# Patient Record
Sex: Male | Born: 1945 | Race: White | Hispanic: No | Marital: Married
Health system: Southern US, Community
[De-identification: ages and names within clinical notes are randomized; demographics above are authoritative.]

---

## 2004-10-02 ENCOUNTER — Ambulatory Visit: Payer: Self-pay | Admitting: Pulmonary Disease

## 2005-03-05 ENCOUNTER — Encounter: Admission: RE | Admit: 2005-03-05 | Discharge: 2005-03-05 | Payer: Self-pay | Admitting: Internal Medicine

## 2005-06-25 ENCOUNTER — Encounter: Admission: RE | Admit: 2005-06-25 | Discharge: 2005-06-25 | Payer: Self-pay | Admitting: Internal Medicine

## 2005-09-27 ENCOUNTER — Ambulatory Visit: Payer: Self-pay | Admitting: Pulmonary Disease

## 2005-10-18 ENCOUNTER — Ambulatory Visit: Payer: Self-pay | Admitting: Pulmonary Disease

## 2006-03-05 ENCOUNTER — Encounter: Admission: RE | Admit: 2006-03-05 | Discharge: 2006-03-05 | Payer: Self-pay | Admitting: Specialist

## 2007-01-08 ENCOUNTER — Encounter: Admission: RE | Admit: 2007-01-08 | Discharge: 2007-01-08 | Payer: Self-pay | Admitting: General Surgery

## 2007-01-28 ENCOUNTER — Ambulatory Visit (HOSPITAL_COMMUNITY): Admission: RE | Admit: 2007-01-28 | Discharge: 2007-01-28 | Payer: Self-pay | Admitting: General Surgery

## 2007-04-05 ENCOUNTER — Emergency Department (HOSPITAL_COMMUNITY): Admission: EM | Admit: 2007-04-05 | Discharge: 2007-04-05 | Payer: Self-pay | Admitting: Family Medicine

## 2007-08-14 ENCOUNTER — Encounter: Admission: RE | Admit: 2007-08-14 | Discharge: 2007-08-14 | Payer: Self-pay | Admitting: Cardiology

## 2007-08-20 ENCOUNTER — Inpatient Hospital Stay (HOSPITAL_COMMUNITY): Admission: RE | Admit: 2007-08-20 | Discharge: 2007-08-21 | Payer: Self-pay | Admitting: Cardiology

## 2007-10-27 ENCOUNTER — Encounter: Admission: RE | Admit: 2007-10-27 | Discharge: 2007-12-01 | Payer: Self-pay | Admitting: General Surgery

## 2007-11-10 ENCOUNTER — Inpatient Hospital Stay (HOSPITAL_COMMUNITY): Admission: RE | Admit: 2007-11-10 | Discharge: 2007-11-11 | Payer: Self-pay | Admitting: General Surgery

## 2007-12-03 ENCOUNTER — Encounter: Admission: RE | Admit: 2007-12-03 | Discharge: 2008-01-13 | Payer: Self-pay | Admitting: General Surgery

## 2008-01-19 ENCOUNTER — Encounter: Admission: RE | Admit: 2008-01-19 | Discharge: 2008-01-19 | Payer: Self-pay | Admitting: General Surgery

## 2009-02-03 ENCOUNTER — Encounter: Admission: RE | Admit: 2009-02-03 | Discharge: 2009-02-03 | Payer: Self-pay | Admitting: General Surgery

## 2009-11-09 ENCOUNTER — Ambulatory Visit (HOSPITAL_COMMUNITY): Admission: RE | Admit: 2009-11-09 | Discharge: 2009-11-09 | Payer: Self-pay | Admitting: Surgery

## 2010-02-13 IMAGING — RF DG UGI W/ KUB
17 of 24 series · 17 of 24 positions shown · non-contrast
Comparison: Abdominal film dated 11/11/2007 and preoperative upper
GI series dated 01/28/2007

CLINICAL DATA: Status post lap band surgery in November 2007.
The patient is experiencing periodic nausea and vomiting.

UPPER GI SERIES WITH KUB
TECHNIQUE: Routine upper GI series was performed with thin barium.
Fluoroscopy Time: 3.2 minutes

[Series 1: run · 1 of 1 slices shown (1 of 16)]
[im 1/1]
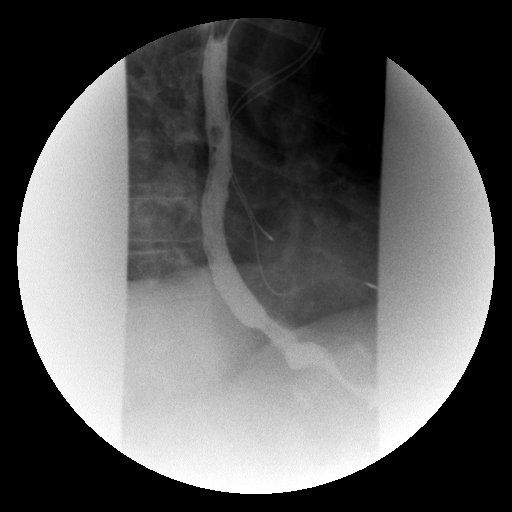

[Series 3: run · 1 of 4 slices shown (2 of 16)]
[im 1/4]
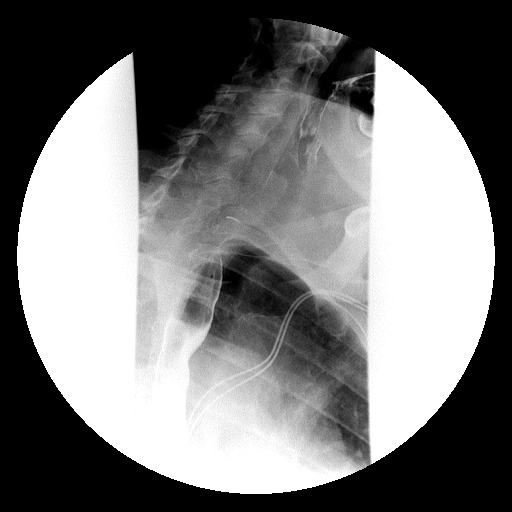

[Series 4: run · 1 of 3 slices shown (3 of 16)]
[im 1/3]
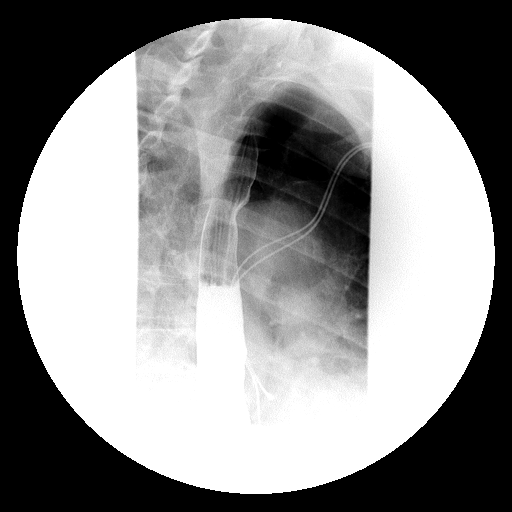

[Series 5: run · 1 of 1 slices shown (4 of 16)]
[im 1/1]
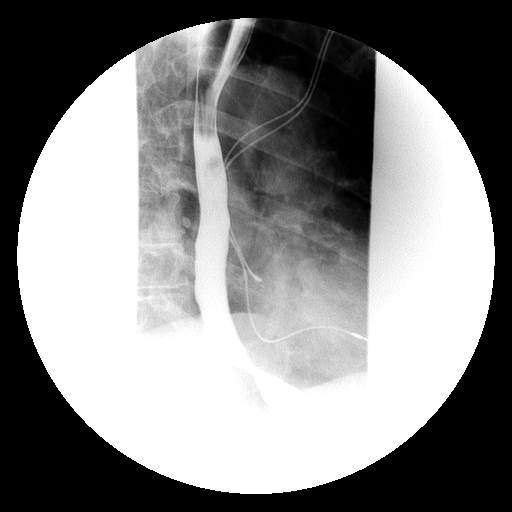

[Series 7: run · 1 of 1 slices shown (5 of 16)]
[im 1/1]
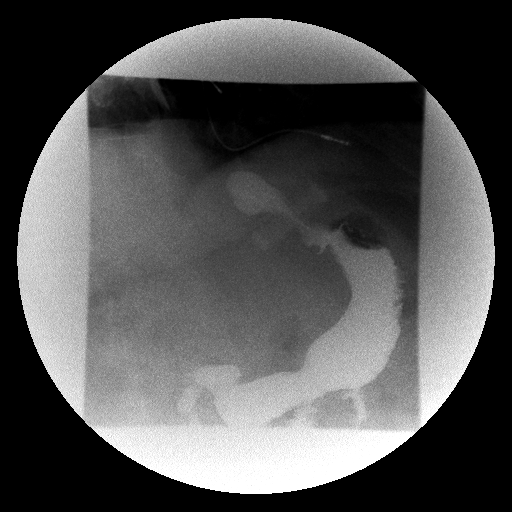

[Series 8: run · 1 of 1 slices shown (6 of 16)]
[im 1/1]
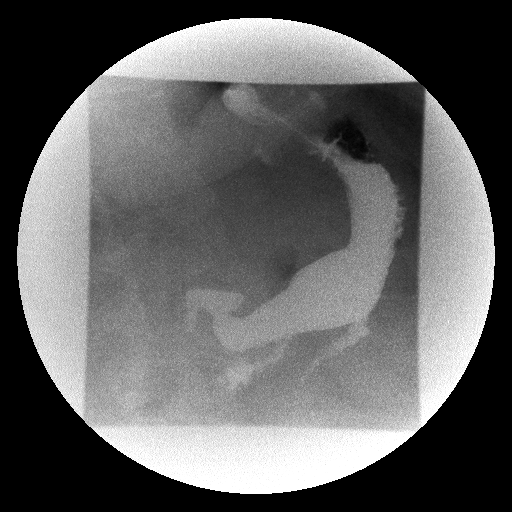

[Series 10: run · 1 of 1 slices shown (7 of 16)]
[im 1/1]
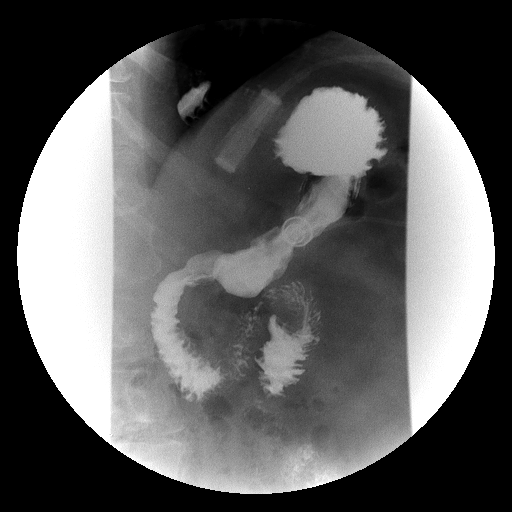

[Series 11: run · 1 of 1 slices shown (8 of 16)]
[im 1/1]
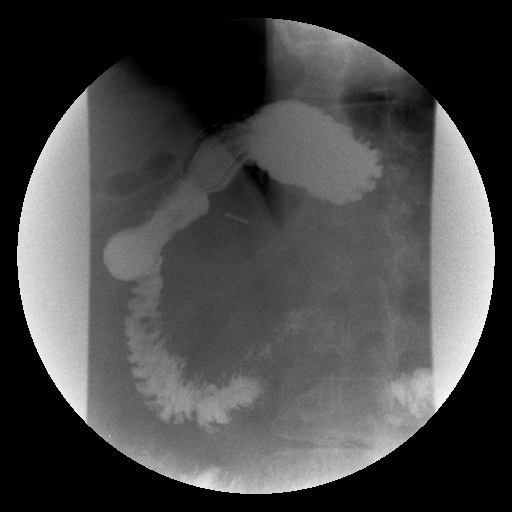

[Series 13: run · 1 of 1 slices shown (9 of 16)]
[im 1/1]
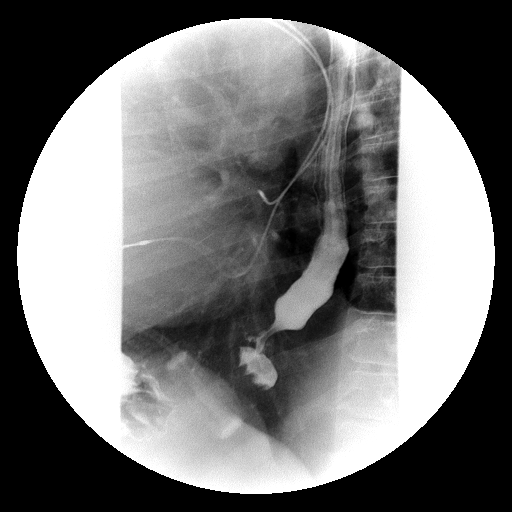

[Series 14: run · 1 of 1 slices shown (10 of 16)]
[im 1/1]
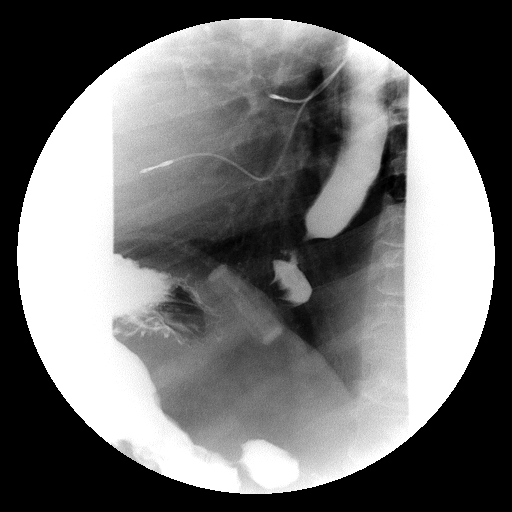

[Series 15: run · 1 of 1 slices shown (11 of 16)]
[im 1/1]
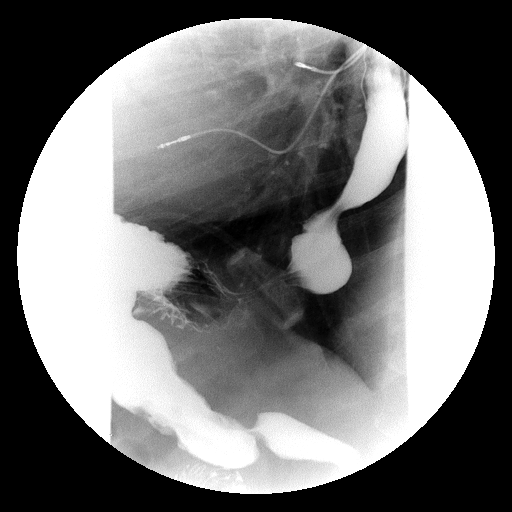

[Series 17: run · 1 of 1 slices shown (12 of 16)]
[im 1/1]
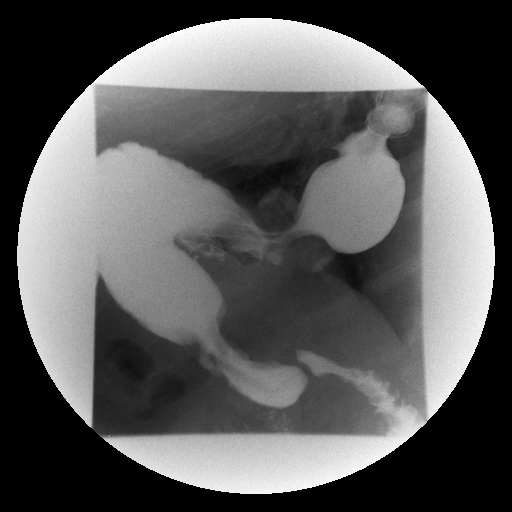

[Series 18: run · 1 of 1 slices shown (13 of 16)]
[im 1/1]
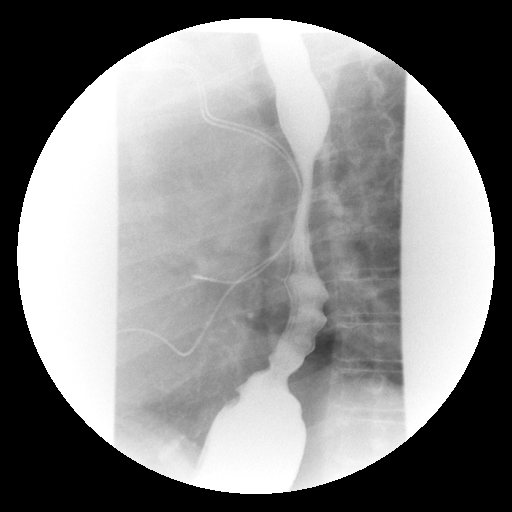

[Series 20: run · 1 of 1 slices shown (14 of 16)]
[im 1/1]
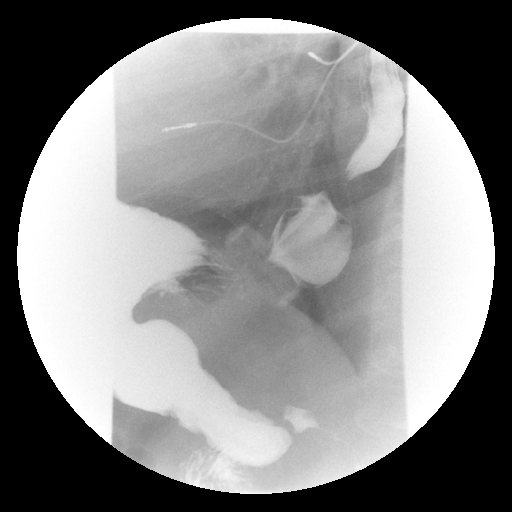

[Series 21: run · 1 of 1 slices shown (15 of 16)]
[im 1/1]
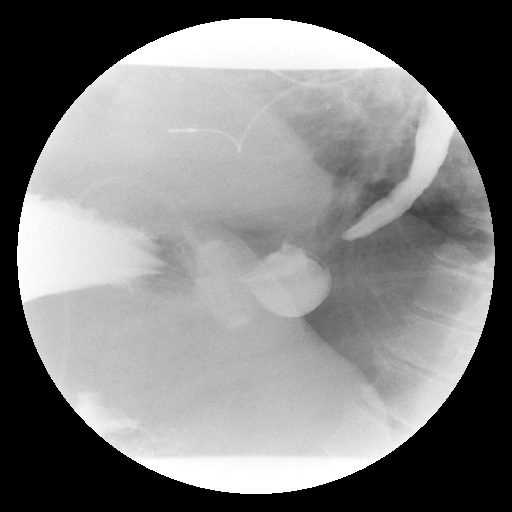

[Series 22: run · 1 of 1 slices shown (16 of 16)]
[im 1/1]
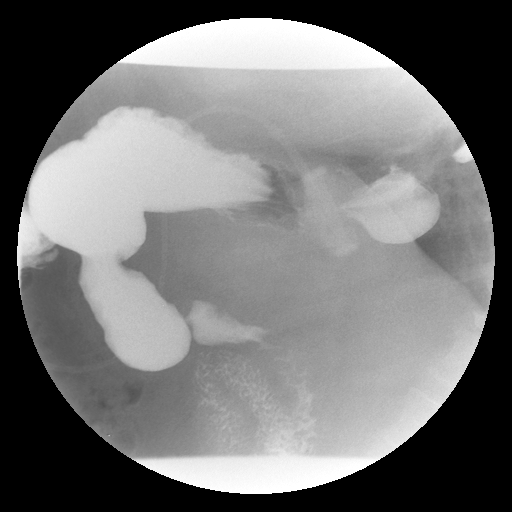

[Series 1001: view not recorded · 0.20mm/px · 1 of 1 slices shown]
[im 1/1]
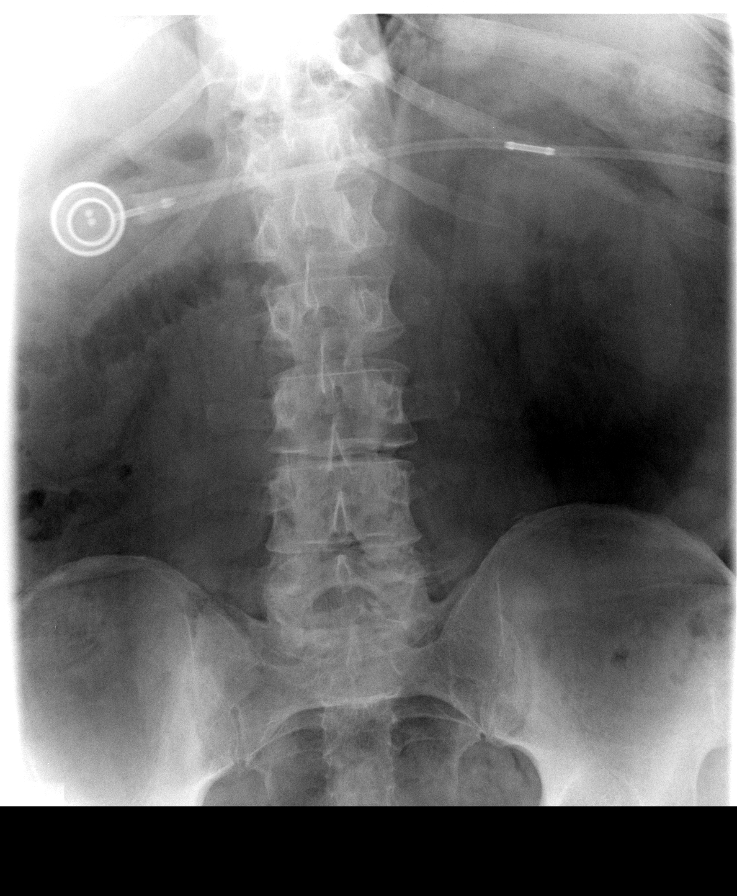

[17 of 24 positions shown; findings below may reference images not displayed]

FINDINGS: Initial KUB demonstrates stable positioning of the lap
band oriented in roughly a 2 o'clock to 8 o'clock position.
Initial upright imaging shows transit of barium through the
esophagus and proximal stomach without evidence of obstruction at
the level of the lap band.

In the prone drinking position, there was evidence of esophageal
dysmotility with tertiary contractions present.  There also is
evidence of a sliding hiatal hernia above the level of the lap
band.  Size of this hernia is very similar to the appearance on the
preoperative upper GI series.  There also does appear to be a
potential component of mild distal esophageal stricture just above
the hiatal hernia in a similar appearance to the preoperative upper
GI series.  A barium tablet was not given due to the presence of
the lap band.

Opening at the level of the lap band is not obstructive to the
passage of barium.  Lumen at the level of the band does not appear
too tight.  Visualized stomach distal to the band is normal in
appearance.  Gastric emptying into the duodenum is normal.
IMPRESSION: Stable positioning of lap band without evidence of band slippage or
erosion. No evidence of obstruction at the level of the band.
Small sliding hiatal hernia is present above the band with
associated suggestion of persistent mild distal esophageal
stricture.  The appearance of the hiatal hernia and esophageal
stricture is similar to the preoperative upper GI series in [DATE].

## 2010-05-24 LAB — CLOTEST (H. PYLORI), BIOPSY: Helicobacter screen: NEGATIVE

## 2010-07-24 NOTE — Op Note (Signed)
NAME:  Jose Chung, Jose Chung              ACCOUNT NO.:  [id number]   MEDICAL RECORD NO.:  [id number]          PATIENT TYPE:  INP   LOCATION:  0003                         FACILITY:  [facility]   PHYSICIAN:  Sharlet Salina T. Hoxworth, M.D.DATE OF BIRTH:  [birth year]   DATE OF PROCEDURE:  11/10/2007  DATE OF DISCHARGE:                               OPERATIVE REPORT   PREOPERATIVE DIAGNOSIS:  Morbid obesity.   POSTOPERATIVE DIAGNOSIS:  Morbid obesity.   SURGICAL PROCEDURE:  Placement of laparoscopic adjustable gastric band.   ASSISTANT:  Dr. Luretha Murphy.   ANESTHESIA:  General.   BRIEF HISTORY:  Jose Chung is a 65 year old male with progressive  morbid obesity, unresponsive to medical management, who presents with co-  morbidities of severe sleep apnea, hypertension, and arthritis.  His  preoperative weight at presentation was 327 pounds with a BMI of 47.  After discussion of options and risks extensively detailed elsewhere, we  elected proceed with placement of laparoscopic adjustable gastric band  for treatment of his morbid obesity.   DESCRIPTION OF OPERATION:  The patient was brought to the operating  room, placed in the supine position on the operating table, and general  endotracheal anesthesia was induced.  He had received preoperative IV  antibiotics.  Subcutaneous heparin had been administered.  PAS hose were  in place.  Correct patient and procedure were verified.  The trocar  sites were infiltrated local anesthesia.  Access was obtained in the  left subcostal space with an 11-mm Optiview trocar without difficulty  and pneumoperitoneum established.  Under direct vision, a 15-mm trocar  was placed lateral in the right upper quadrant, an 11-mm trocar in the  midclavicular line right upper quadrant, another 11-mm trocar just to  the left of the midline for the camera port, and another 5-mm trocar  lateral in the left flank.   Through a 5-mm subxiphoid site, the [facility] retractor was  placed and  the left lobe of the liver elevated with good exposure of the hiatus and  upper stomach.  The peritoneum overlying the left crus was incised,  leaving some lateral attachments and blunt dissection was carried down  along the left crus toward the retrogastric space with the finger  dissector.  The gastrohepatic omentum was opened along an avascular  area.  It was quite fatty.  The base of the right crus was identified  and the peritoneum incised here just anterior at the area of crossing  fat.  The Ewald tube was then placed down into the stomach transorally  and the balloon inflated to 15 mL and pulled back snugly against the  hiatus and there was no evidence of hiatal hernia.  The balloon was  deflated and the tube pulled back.  The finger dissector was then passed  just anterior to the right crus retrogastric and deployed up to the  previously dissected area in the angle of His without difficulty.   A flushed AP large band system was introduced into the abdomen, the  tubing placed through the finger retractor and brought back behind the  stomach with difficulty and the band brought  back behind the stomach.  The Ewald tube was then placed back into the stomach and the band  buckled into place without any undue tension.  Prior to imbricating the  fundus up over the band, a large fat pad along the greater curve near  the angle of His was excised to allow this to be done under no tension.  The fundus was then imbricated up over the band to the small gastric  pouch with three interrupted 2-0 Ethibond sutures.  The band appeared to  be in good position without any undue tension.   At this point, the operative site was inspected for hemostasis.  There  was no evidence of any trocar injury.  The trocars were removed and all  CO2 evacuated after bringing the tubing out through the right mid  abdominal trocar site.  This incision was lengthened somewhat and the  anterior fascia  exposed.  Four 2-0 Prolene sutures were placed in the  fascia.  The tubing was cut and the port attached and then secured to  the anterior abdominal wall with the previously placed sutures.  The  tubing was seen to curve smoothly into the abdomen.  The subcu at this  site was closed with a running 2-0 Vicryl.  Skin incisions were closed  with subcuticular Monocryl and Dermabond.  Sponge, needle, and  instrument counts were correct.  The patient was taken to the recovery  room in good condition.      Jose Chung. Hoxworth, M.D.  Electronically Signed     BTH/MEDQ  D:  11/10/2007  T:  11/10/2007  Job:  [id number]

## 2010-07-27 NOTE — Assessment & Plan Note (Signed)
[facility]                               PULMONARY OFFICE NOTE   NAME:Jose Chung, Jose Chung                     MRN:          [id number]  DATE:09/27/2005                            DOB:          [birth year]    HISTORY OF PRESENT ILLNESS:  The patient is a 65 year old gentleman who I  have seen in the past for obstructive sleep apnea who comes in today for  evaluation of cough.  The patient states he has had a cough for about 5-6  months which is worse at night, especially upon lying down.  The patient has  been tried on inhalers which has not helped but states that occasionally his  wife's nebulizer machine will help.  He has occasional mucus but when he  does it is typically clear.  The patient has tried over-the-counter  antihistamines and other allergy preparations without improvement.  He  denies a lot of throat clearing.  He does complain of a tickle in his throat  and does feel post nasal drip at times.  He has occasional reflux symptoms.  The patient is on Lisinopril for hypertension and had a trial of 2 weeks off  the medication to see if he would improve, which he did not.   PAST MEDICAL HISTORY:  1. Hypertension.  2. History of sleep apnea.  3. History of multiple orthopedic procedures.   MEDICATIONS:  1. Cymbalta 90 mg q.h.s.  2. Lisinopril 40 mg every day.  3. Aspirin 325 every day.  4. Dyazide of unknown dose every day.  5. Wellbutrin XL 450 every day.  6. Lipitor 40 mg q.h.s.  7. Cartia XT 1 q.h.s.  8. Variouss vitamins.  9. Albuterol 2 puffs q.4-6h. p.r.n.   THE PATIENT HAS NO KNOWN DRUG ALLERGIES.   SOCIAL HISTORY:  He is married and has children.  He is retired/on  disability.  He has a history of smoking in the past but has not smoked for  20 years.   FAMILY HISTORY:  Remarkable for heart disease, joint disease, as well as  allergies.   REVIEW OF SYSTEMS:  As per history of present illness, also see __________  documented in the chart.   PHYSICAL EXAMINATION:  GENERAL:  He is an obese male in no acute distress.  VITAL SIGNS:  Blood pressure is 120/78, pulse 87, temperature is 98.3,  weight is 309 pounds, O2 saturation on room air is 97%.  HEENT:  Pupils equal, round, reactive to light and accommodation.  Extraocular muscles are intact.  Nares show deviated septum to the left.  Oropharynx shows elongation of the soft palate and uvula.  NECK:  Supple without JVD or lymphadenopathy.  There is no palpable  thyromegaly.  CHEST:  Totally clear.  CARDIAC:  Reveals a regular rate and rhythm.  No murmurs, rubs, or gallops.  ABDOMEN:  Soft nontender with good bowel sounds.  GENITAL/RECTAL/BREAST:  Were not done and not indicated.  EXTREMITIES:  Lower extremities are without edema.  Pulses are intact  distally.  NEUROLOGIC:  He is alert and oriented with no obvious motor deficits.  IMPRESSION:  Cough that I suspect is primarily in the upper airway rather  than the lower airway.  I suspect that postnasal drip and more than likely a  laryngopharyngeal reflux are contributing to his cough.  The history is  almost classic.  I am also concerned about the ACE inhibitor from the  standpoint of propagating the cough, not necessarily causing it.  Unfortunately, two weeks off of an ACE inhibitor is not long enough to give  the medication totally out of his system.  There is nothing at this point in  time to suggest lower airway disease.   PLAN:  1. We will initiate treatment for laryngopharyngeal reflux with Aciphex 20      mg p.o. b.i.d.  2. The patient is to call his primary care physician/cardiologist to see      about getting off the ACE inhibitor for an 8-week trial.  3. If the patient does not improve, we will consider further workup of his      sinuses/possible allergies.  4. He will follow up in 3-4 weeks or sooner if there are problems.                                   Barbaraann Share, MD,  FCCP   [facility]/MedQ  DD:  10/18/2005  DT:  10/18/2005  Job #:  [id number]   cc:   Georgann Housekeeper, MD

## 2010-07-27 NOTE — Assessment & Plan Note (Signed)
[facility]                               PULMONARY OFFICE NOTE   NAME:Jose Chung, Jose Chung                     MRN:          [id number]  DATE:10/18/2005                            DOB:          1946/01/18    FOLLOWUP   SUBJECTIVE:  Jose Chung comes in today for followup of his cough.  At last  visit, he was placed on b.i.d. proton pump inhibitor and asked to try and  get off an ACE inhibitor.  Unfortunately, he has not been able to stay off  the ACE inhibitor secondary to his hypertension.  He is going to talk with  his cardiologist again.  Patient states that his cough is 50% improved on  the b.i.d. Aciphex.  He especially has noticed great improvement in the  evening when he goes to bed and lies down.  Currently, there is no mucus  production.  Overall, he is very happy with the progress that he has made.   PHYSICAL EXAMINATION:  In general, he is an obese white male in no acute  distress.  Blood pressure is 108/70.  Pulse:  97.  Temperature is 98.1.  Weight is 312 pounds.  O2 saturation on room air is 95%.   IMPRESSION:  Cough that is most likely secondary to laryngopharyngeal reflux  with a possible contribution/propagating mechanism from the ACE inhibitor.  Patient is much improved on the Aciphex.   PLAN:  I have asked him to stay on the b.i.d. Aciphex for at least the next  four weeks and to call me with his progress.  He is continuing to do well.  He needs to work aggressively on weight loss since this is one of the big  factors for reflux disease.  At some point, he may be able to bring the  Aciphex down to q.day dosing.  If he is continuing to have cough, it is  imperative that he try and get off the ACE inhibitor for at least eight  weeks to see if this a contributing factor.                                    Barbaraann Share, MD, FCCP   [facility]/MedQ  DD:  10/18/2005  DT:  10/18/2005  Job #:  [id number]   cc:   Georgann Housekeeper, MD

## 2010-11-29 LAB — POCT URINALYSIS DIP (DEVICE)
Glucose, UA: NEGATIVE
Nitrite: POSITIVE — AB
pH: 6

## 2010-11-29 LAB — URINE CULTURE: Colony Count: >=100000

## 2010-12-12 LAB — CBC
Hemoglobin: 13.7
MCHC: 33.4
RBC: 4.32

## 2010-12-12 LAB — DIFFERENTIAL
Lymphocytes Relative: 23
Monocytes Absolute: 0.7
Monocytes Relative: 8
Neutro Abs: 6.9

## 2010-12-12 LAB — HEMOGLOBIN AND HEMATOCRIT, BLOOD: Hemoglobin: 13.6

## 2011-05-14 ENCOUNTER — Telehealth (INDEPENDENT_AMBULATORY_CARE_PROVIDER_SITE_OTHER): Payer: Self-pay | Admitting: General Surgery

## 2011-05-14 NOTE — Telephone Encounter (Signed)
05/14/11 mailed recall letter for bariatric surgery follow-up. Advised the patient to call [facility] @ [phone] to schedule an appointment...cef °

## 2012-06-18 ENCOUNTER — Telehealth (INDEPENDENT_AMBULATORY_CARE_PROVIDER_SITE_OTHER): Payer: Self-pay | Admitting: General Surgery

## 2012-06-18 NOTE — Telephone Encounter (Signed)
06/18/12 tried to reach by phone - number disconnected - mailed recall letter for pt to schedule a bariatric follow-up appt. Dr. Johna Sheriff did lap band surgery 11/10/07. (lss)

## 2015-12-15 ENCOUNTER — Encounter (HOSPITAL_COMMUNITY): Payer: Self-pay

## 2016-01-11 ENCOUNTER — Telehealth (HOSPITAL_COMMUNITY): Payer: Self-pay

## 2016-01-11 NOTE — Telephone Encounter (Signed)
This patient is overdue for recommended follow-up with a bariatric surgeon at [facility]. A letter was mailed to the address on file 12/15/15 from both [place] & [facility] in attempt to reestablish post-op care. Letter has been returned to [place] marked undeliverable, unable to forward. No additional address on file in [facility] or [employer]. Information was shared with Frances Jackson today at [facility] so she may contact the patient via phone in attempt to get the patient scheduled for an appointment in their office. °

## 2016-06-11 ENCOUNTER — Encounter (HOSPITAL_COMMUNITY): Payer: Self-pay

## 2017-06-09 ENCOUNTER — Encounter (HOSPITAL_COMMUNITY): Payer: Self-pay
